# Patient Record
Sex: Female | Born: 1975 | Race: White | Hispanic: No | Marital: Single | Smoking: Never smoker
Health system: Southern US, Community
[De-identification: ages and names within clinical notes are randomized; demographics above are authoritative.]

---

## 2010-07-11 ENCOUNTER — Ambulatory Visit: Payer: Self-pay | Admitting: Internal Medicine

## 2012-08-30 IMAGING — CR DG SHOULDER 3+V*R*
1 series · 4 of 4 positions shown · non-contrast
Comparison: none

REASON FOR EXAM: R shoulder pain due to fall
COMMENTS:

PROCEDURE:     MDR - MDR SHOULDER RIGHT COMPLETE  - July 11, 2010  [DATE]
RESULT:     Comparison: None.

[Series 1: view not recorded · 0.17mm/px · 4 of 4 slices shown]
[im 1/4]
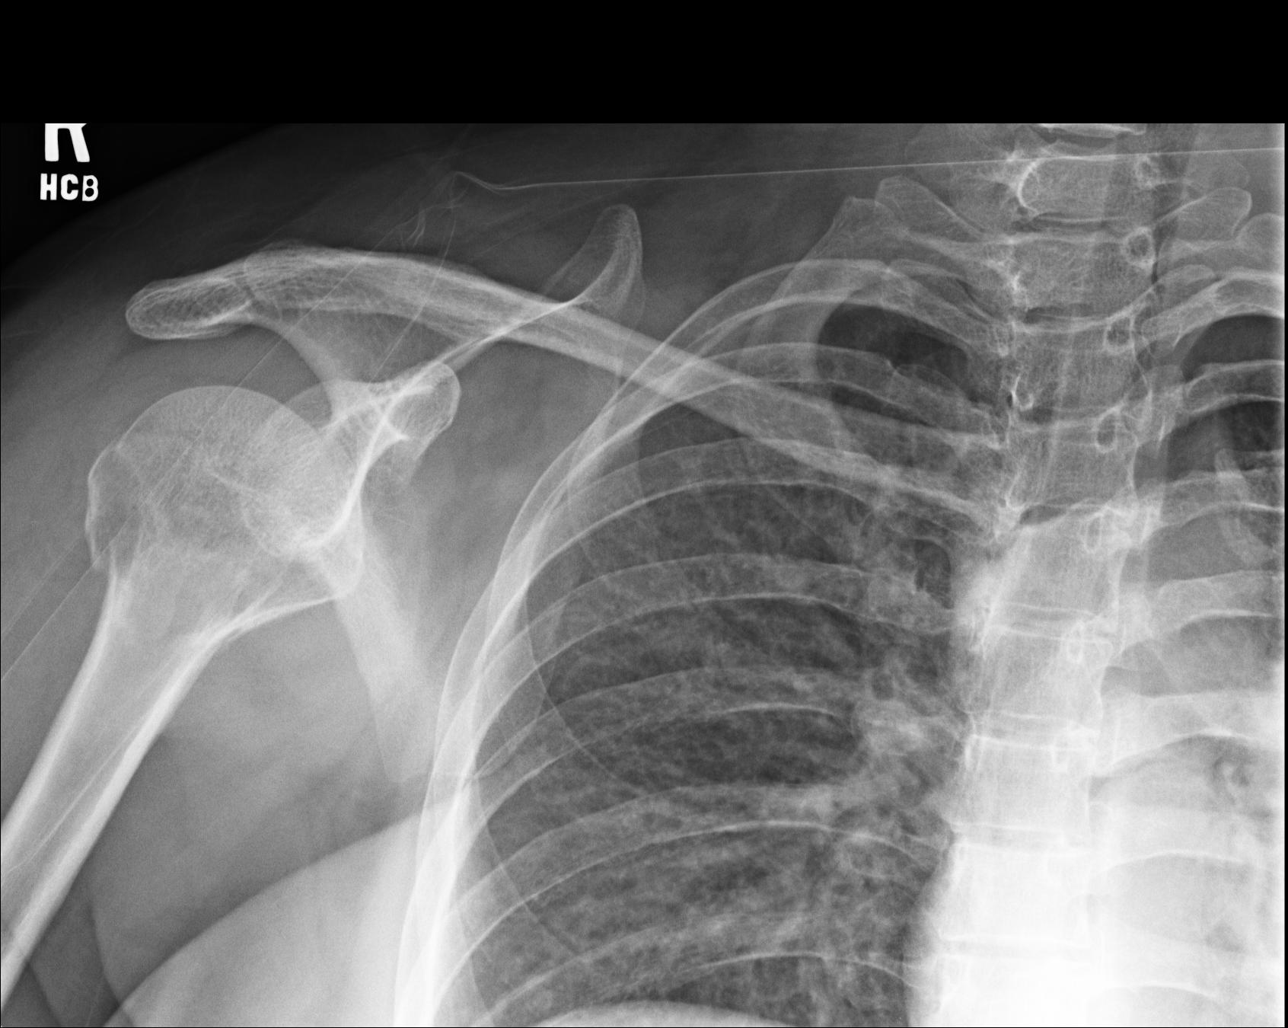
[im 2/4]
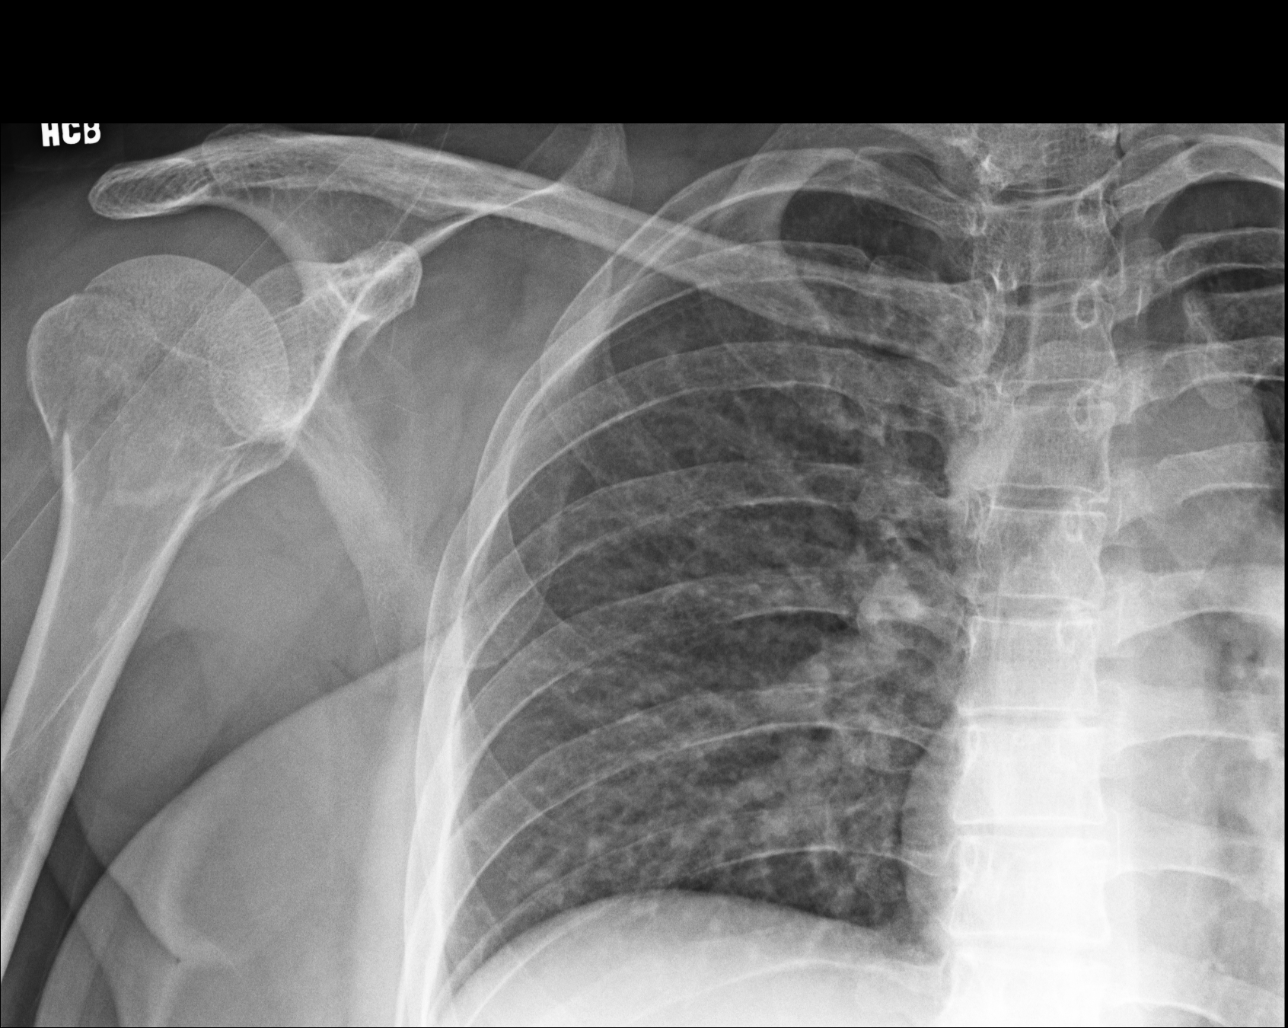
[im 3/4]
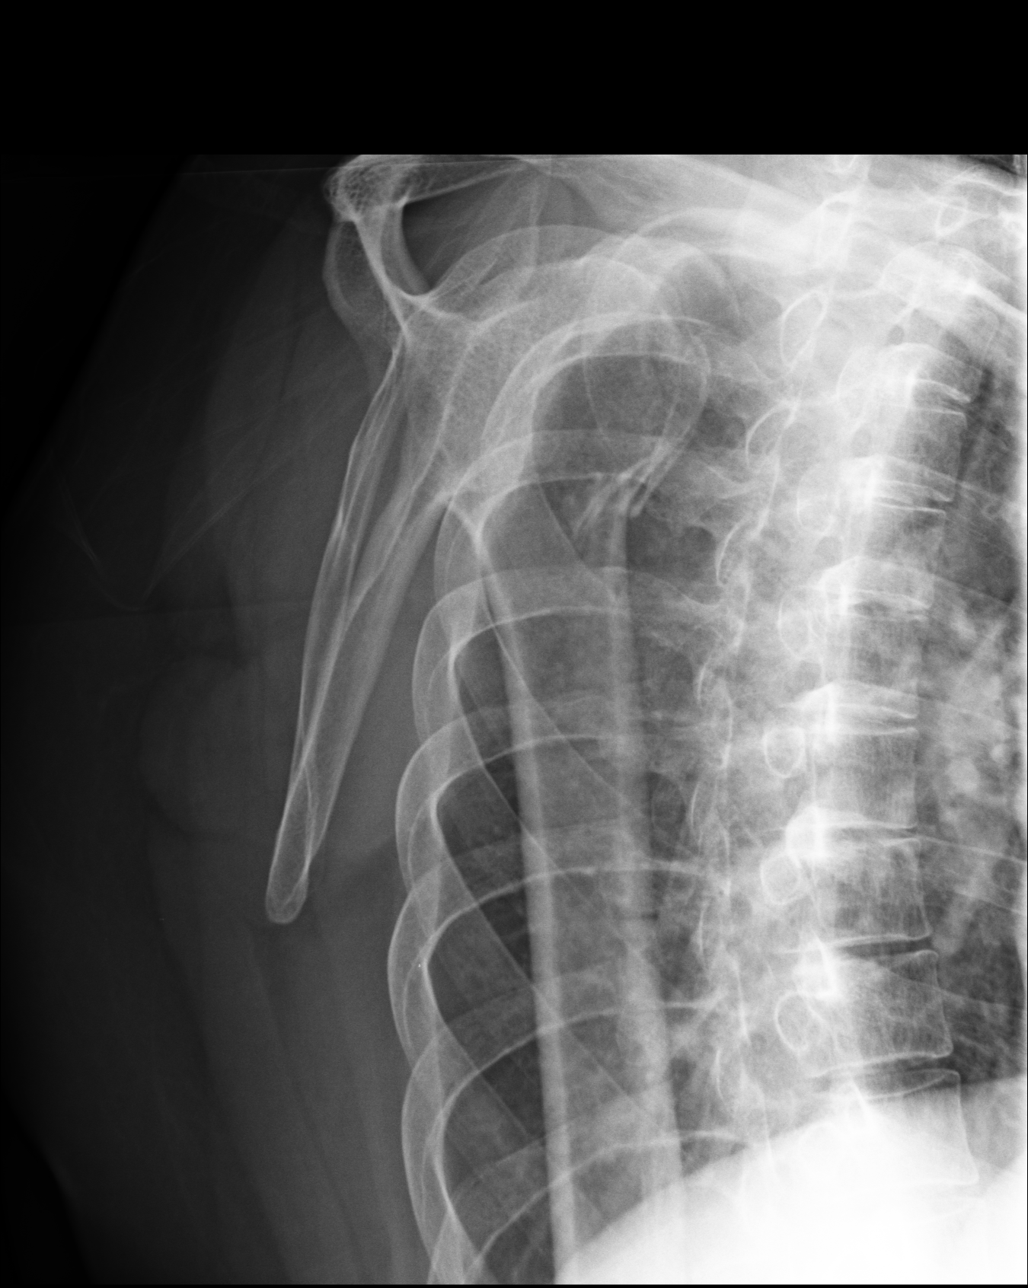
[im 4/4]
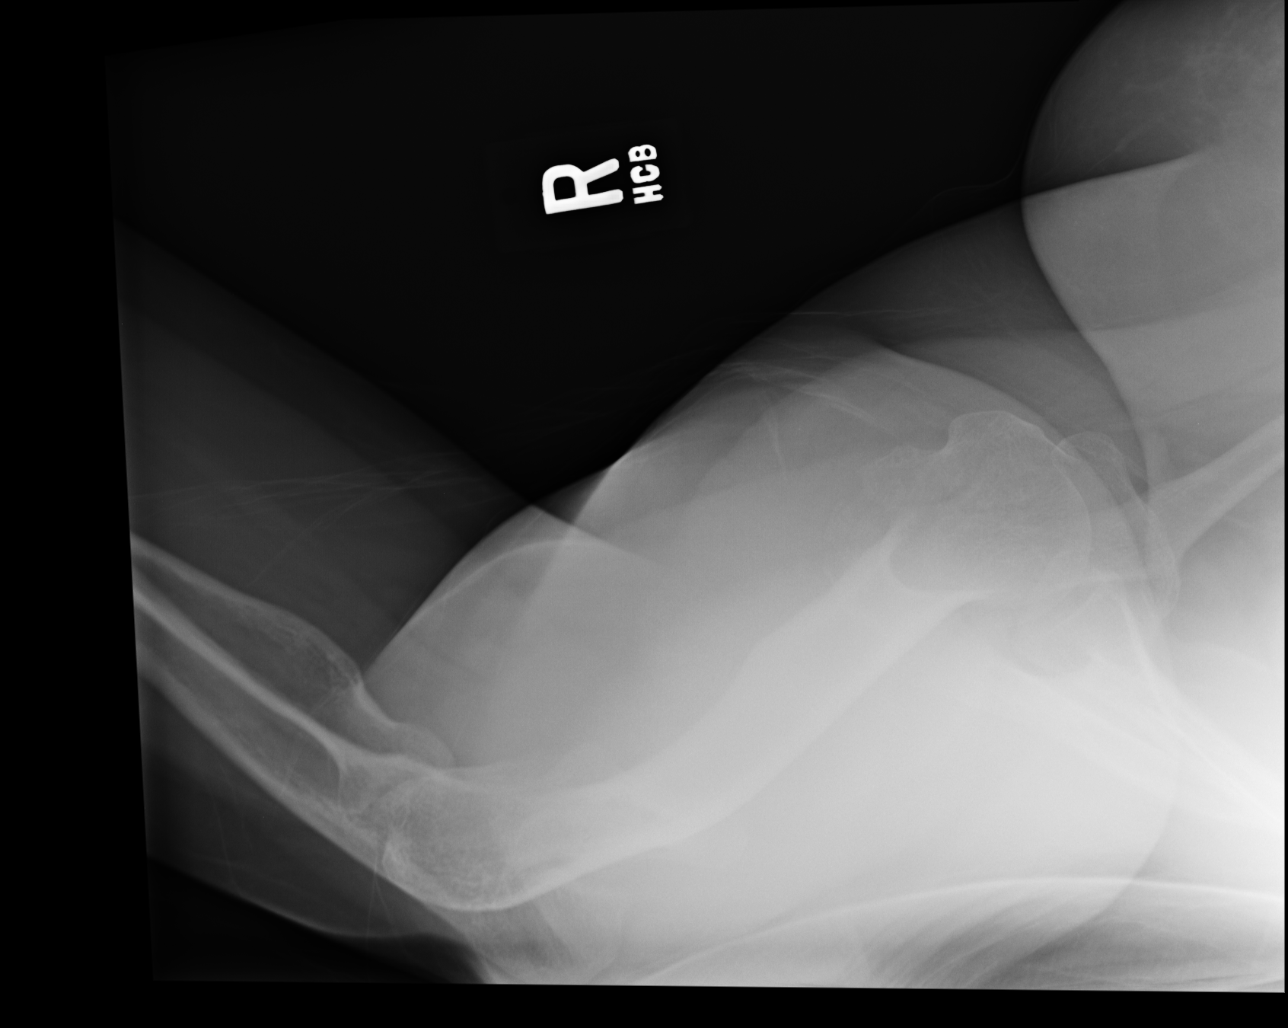

[4 of 4 positions shown; findings below may reference images not displayed]

FINDINGS: There is a comminuted fracture of the humeral head and neck junction. The
fracture appears to involves the greater tuberosity. The humeral head is
located within the glenoid fossa.
IMPRESSION: Comminuted fracture of the humeral head and neck junction.

## 2014-07-20 ENCOUNTER — Ambulatory Visit
Admit: 2014-07-20 | Disposition: A | Discharge status: Home / Self Care | Payer: Self-pay | Attending: Internal Medicine | Admitting: Internal Medicine

## 2016-03-14 HISTORY — PX: COLPOSCOPY: SHX161

## 2016-03-25 ENCOUNTER — Ambulatory Visit
Admission: EM | Admit: 2016-03-25 | Discharge: 2016-03-25 | Disposition: A | Discharge status: Home / Self Care | Payer: BLUE CROSS/BLUE SHIELD | Attending: Internal Medicine | Admitting: Internal Medicine

## 2016-03-25 DIAGNOSIS — R109 Unspecified abdominal pain: Secondary | ICD-10-CM

## 2016-03-25 DIAGNOSIS — R350 Frequency of micturition: Secondary | ICD-10-CM | POA: Diagnosis not present

## 2016-03-25 LAB — URINALYSIS, COMPLETE (UACMP) WITH MICROSCOPIC
BILIRUBIN URINE: NEGATIVE
Glucose, UA: NEGATIVE mg/dL
KETONES UR: NEGATIVE mg/dL
NITRITE: NEGATIVE
PROTEIN: NEGATIVE mg/dL
Specific Gravity, Urine: 1.01 (ref 1.005–1.030)
pH: 7 (ref 5.0–8.0)

## 2016-03-25 MED ORDER — ONDANSETRON 8 MG PO TBDP
8.0000 mg | ORAL_TABLET | Freq: Once | ORAL | Status: AC | PRN
  Administered 2016-03-25: 8 mg via ORAL

## 2016-03-25 MED ORDER — IBUPROFEN 800 MG PO TABS
800.0000 mg | ORAL_TABLET | Freq: Once | ORAL | Status: AC
  Administered 2016-03-25: 800 mg via ORAL

## 2016-03-25 MED ORDER — CIPROFLOXACIN HCL 500 MG PO TABS: 500.0000 mg | ORAL_TABLET | Freq: Two times a day (BID) | ORAL | 0 refills | Status: AC

## 2016-03-25 MED ORDER — IBUPROFEN 600 MG PO TABS: 600.0000 mg | ORAL_TABLET | Freq: Once | ORAL | Status: DC

## 2016-03-25 MED ORDER — ONDANSETRON HCL 4 MG PO TABS: 8.0000 mg | ORAL_TABLET | ORAL | 0 refills | Status: AC | PRN

## 2016-03-25 NOTE — ED Triage Notes (Signed)
Patient complains of back pain and bladder pressure that originally started 8 days ago, improved and worsened again on Wednesday. Patient states that back pain is severe with nausea.

## 2016-03-25 NOTE — ED Provider Notes (Signed)
[facility]    CSN: [id number] Arrival date & time: 03/25/16  [id number]     History   Chief Complaint Chief Complaint  Patient presents with  . Back Pain    HPI Monique Serrano is a 40 y.o. female. She presents today with recent history of a colposcopy November 27. She subsequently had that of menses on November 29.  On November 30, had onset of pelvic pressure, urinary frequency/urgency, and right-sided back/flank discomfort. She has been taking 400 mg of ibuprofen and using the heating pad. She felt better by December 5, but then was up all night last night with recurrent discomfort, and is here today. She has had nausea for the last 3 days, no vomiting, no diarrhea. Last bowel movement was this morning, and was normal. No unusual vaginal discharge. No prior history of UTI or kidney stone; father and sister have had kidney stones.    HPI  History reviewed. No pertinent past medical history.   Past Surgical History:  Procedure Laterality Date  . COLPOSCOPY  03/14/2016     Home Medications    Prior to Admission medications   Medication Sig Start Date End Date Taking? Authorizing Provider  buPROPion (WELLBUTRIN XL) 150 MG 24 hr tablet Take 150 mg by mouth daily.   Yes Historical Provider, MD  levonorgestrel-ethinyl estradiol (AVIANE,ALESSE,LESSINA) 0.1-20 MG-MCG tablet Take 1 tablet by mouth daily.   Yes Historical Provider, MD  LORazepam (ATIVAN) 0.5 MG tablet Take 0.5 mg by mouth every 8 (eight) hours.   Yes Historical Provider, MD  sertraline (ZOLOFT) 100 MG tablet Take 100 mg by mouth daily.   Yes Historical Provider, MD  valACYclovir (VALTREX) 1000 MG tablet Take 1,000 mg by mouth 2 (two) times daily.   Yes Historical Provider, MD  ciprofloxacin (CIPRO) 500 MG tablet Take 1 tablet (500 mg total) by mouth 2 (two) times daily. 03/25/16   Eustace MooreLaura W Denajah Farias, MD  ondansetron (ZOFRAN) 4 MG tablet Take 2 tablets (8 mg total) by mouth every 4 (four) hours as needed for nausea  or vomiting. 03/25/16   Eustace MooreLaura W Raya Mckinstry, MD    Family History History reviewed. No pertinent family history.  Social History Social History  Substance Use Topics  . Smoking status: Never Smoker  . Smokeless tobacco: Never Used  . Alcohol use No     Allergies   Patient has no known allergies.   Review of Systems Review of Systems  All other systems reviewed and are negative.    Physical Exam Triage Vital Signs ED Triage Vitals  Enc Vitals Group     BP 03/25/16 1045 120/89     Pulse Rate 03/25/16 1045 97     Resp 03/25/16 1045 17     Temp 03/25/16 1045 98 F (36.7 C)     Temp Source 03/25/16 1045 Oral     SpO2 03/25/16 1045 100 %     Weight 03/25/16 1041 193 lb (87.5 kg)     Height 03/25/16 1041 5\' 1"  (1.549 m)     Pain Score 03/25/16 1045 9   Updated Vital Signs BP 120/89 (BP Location: Left Arm)   Pulse 97   Temp 98 F (36.7 C) (Oral)   Resp 17   Ht 5\' 1"  (1.549 m)   Wt 193 lb (87.5 kg)   LMP 03/16/2016   SpO2 100%   BMI 36.47 kg/m  Physical Exam  Constitutional: She is oriented to person, place, and time. No distress.  Alert, nicely  groomed  HENT:  Head: Atraumatic.  Eyes:  Conjugate gaze, no eye redness/drainage  Neck: Neck supple.  Cardiovascular: Normal rate.   Pulmonary/Chest: No respiratory distress.  Lungs clear, symmetric breath sounds  Abdominal: She exhibits no distension.  Musculoskeletal: Normal range of motion.  No leg swelling  Neurological: She is alert and oriented to person, place, and time.  Skin: Skin is warm and dry.  No cyanosis  Nursing note and vitals reviewed.    UC Treatments / Results  Labs Results for orders placed or performed during the hospital encounter of 03/25/16  Urinalysis, Complete w Microscopic  Result Value Ref Range   Color, Urine YELLOW YELLOW   APPearance HAZY (A) CLEAR   Specific Gravity, Urine 1.010 1.005 - 1.030   pH 7.0 5.0 - 8.0   Glucose, UA NEGATIVE NEGATIVE mg/dL   Hgb urine dipstick TRACE  (A) NEGATIVE   Bilirubin Urine NEGATIVE NEGATIVE   Ketones, ur NEGATIVE NEGATIVE mg/dL   Protein, ur NEGATIVE NEGATIVE mg/dL   Nitrite NEGATIVE NEGATIVE   Leukocytes, UA MODERATE (A) NEGATIVE   Squamous Epithelial / LPF 6-30 (A) NONE SEEN   WBC, UA 6-30 0 - 5 WBC/hpf   RBC / HPF 0-5 0 - 5 RBC/hpf   Bacteria, UA MANY (A) NONE SEEN    Procedures Procedures (including critical care time)  Medications Ordered in UC Medications  ondansetron (ZOFRAN-ODT) disintegrating tablet 8 mg (8 mg Oral Given 03/25/16 1105)  ibuprofen (ADVIL,MOTRIN) tablet 800 mg (800 mg Oral Given 03/25/16 1105)     Final Clinical Impressions(s) / UC Diagnoses   Final diagnoses:  Urinary frequency  Acute right flank pain   Symptoms today could be caused by either a UTI or a kidney stone.  Urine sample suggests UTI, so prescription for cipro sent to the [facility] in [place].  Note for work x 2d, RTW on 03/27/16.  Recheck for further evaluation if not starting to improve in the next 48 hours.  Ibuprofen 800mg  three times daily may provide more pain relief.  New Prescriptions Discharge Medication List as of 03/25/2016 11:16 AM    START taking these medications   Details  ciprofloxacin (CIPRO) 500 MG tablet Take 1 tablet (500 mg total) by mouth 2 (two) times daily., Starting Fri 03/25/2016, Normal    ondansetron (ZOFRAN) 4 MG tablet Take 2 tablets (8 mg total) by mouth every 4 (four) hours as needed for nausea or vomiting., Starting Fri 03/25/2016, Normal         Eustace MooreLaura W Daqwan Dougal, MD 03/25/16 [phone]

## 2016-03-25 NOTE — Discharge Instructions (Addendum)
Symptoms today could be caused by either a UTI or a kidney stone.  Urine sample suggests UTI, so prescription for cipro sent to the [facility] in [place].  Note for work x 2d, RTW on 03/27/16.  Recheck for further evaluation if not starting to improve in the next 48 hours.  Ibuprofen 800mg  three times daily may provide more pain relief.

## 2016-03-26 LAB — URINE CULTURE
Culture: <10000 — AB
Special Requests: NORMAL

## 2020-09-03 ENCOUNTER — Ambulatory Visit
Admission: EM | Admit: 2020-09-03 | Discharge: 2020-09-03 | Disposition: A | Discharge status: Home / Self Care | Payer: BLUE CROSS/BLUE SHIELD | Attending: Sports Medicine | Admitting: Sports Medicine

## 2020-09-03 ENCOUNTER — Other Ambulatory Visit: Payer: Self-pay

## 2020-09-03 DIAGNOSIS — Z793 Long term (current) use of hormonal contraceptives: Secondary | ICD-10-CM | POA: Insufficient documentation

## 2020-09-03 DIAGNOSIS — Z28310 Unvaccinated for covid-19: Secondary | ICD-10-CM | POA: Diagnosis not present

## 2020-09-03 DIAGNOSIS — M791 Myalgia, unspecified site: Secondary | ICD-10-CM | POA: Insufficient documentation

## 2020-09-03 DIAGNOSIS — R52 Pain, unspecified: Secondary | ICD-10-CM

## 2020-09-03 DIAGNOSIS — Z20822 Contact with and (suspected) exposure to covid-19: Secondary | ICD-10-CM | POA: Diagnosis not present

## 2020-09-03 DIAGNOSIS — R5383 Other fatigue: Secondary | ICD-10-CM | POA: Insufficient documentation

## 2020-09-03 DIAGNOSIS — B349 Viral infection, unspecified: Secondary | ICD-10-CM | POA: Diagnosis not present

## 2020-09-03 DIAGNOSIS — Z79899 Other long term (current) drug therapy: Secondary | ICD-10-CM | POA: Diagnosis not present

## 2020-09-03 DIAGNOSIS — J029 Acute pharyngitis, unspecified: Secondary | ICD-10-CM | POA: Diagnosis present

## 2020-09-03 LAB — RESP PANEL BY RT-PCR (FLU A&B, COVID) ARPGX2
Influenza A by PCR: NEGATIVE
Influenza B by PCR: NEGATIVE
SARS Coronavirus 2 by RT PCR: NEGATIVE

## 2020-09-03 LAB — GROUP A STREP BY PCR: Group A Strep by PCR: NOT DETECTED

## 2020-09-03 NOTE — ED Triage Notes (Signed)
Patient complains of sore throat, fatigue, whole body aches, and severe pain with swallowing.

## 2020-09-03 NOTE — Discharge Instructions (Addendum)
Your strep test is negative. Your respiratory panel was negative for COVID and influenza. You probably have some other viral process. Lungs were clear so you do not need an inhaler. There is no bacterial process and no antibiotics. Educational handouts provided. Plenty of rest, plenty of fluids, Tylenol or Motrin for any fever or discomfort. If symptoms persist please see your primary care provider. If they worsen please go to the ER.

## 2020-09-05 NOTE — ED Provider Notes (Signed)
[facility]    CSN: [id number] Arrival date & time: 09/03/20  1204      History   Chief Complaint Chief Complaint  Patient presents with  . Sore Throat    HPI Monique Serrano is a 45 y.o. female.   Patient is a 45 year old female who presents for evaluation of the above issues.  Gets her primary care needs met by family medicine office in [place].  She works as a Radiation protection practitioner.  She reports several issues.  She reports 1 week of just feeling fatigued and rundown.  The second issue is 2 days of myalgias and sore throat with pain with swallowing.  She denies any sick contacts.  She has not been vaccinated against COVID.  She has had her flu shot.  No COVID exposure or sick contacts that she is aware of.  No COVID history.  She denies any cough, chest pain, shortness of breath, urinary symptoms, abdominal pain.  She is not a smoker.  She has no history of COPD.  No history of asthma.  No red flag signs or symptoms appreciated on history.     History reviewed. No pertinent past medical history.  There are no problems to display for this patient.   Past Surgical History:  Procedure Laterality Date  . COLPOSCOPY  03/14/2016    OB History   No obstetric history on file.      Home Medications    Prior to Admission medications   Medication Sig Start Date End Date Taking? Authorizing Provider  buPROPion (WELLBUTRIN XL) 150 MG 24 hr tablet Take 150 mg by mouth daily.   Yes [provider]  levonorgestrel-ethinyl estradiol (AVIANE,ALESSE,LESSINA) 0.1-20 MG-MCG tablet Take 1 tablet by mouth daily.   Yes [provider]  LORazepam (ATIVAN) 0.5 MG tablet Take 0.5 mg by mouth every 8 (eight) hours.   Yes [provider]  sertraline (ZOLOFT) 100 MG tablet Take 100 mg by mouth daily.   Yes [provider]  valACYclovir (VALTREX) 1000 MG tablet Take 1,000 mg by mouth 2 (two) times daily.   Yes [provider]  ciprofloxacin  (CIPRO) 500 MG tablet Take 1 tablet (500 mg total) by mouth 2 (two) times daily. 03/25/16   Wynona Luna, MD  ondansetron (ZOFRAN) 4 MG tablet Take 2 tablets (8 mg total) by mouth every 4 (four) hours as needed for nausea or vomiting. 03/25/16   Wynona Luna, MD    Family History History reviewed. No pertinent family history.  Social History Social History   Tobacco Use  . Smoking status: Never Smoker  . Smokeless tobacco: Never Used  Substance Use Topics  . Alcohol use: No  . Drug use: No     Allergies   Patient has no known allergies.   Review of Systems Review of Systems  Constitutional: Positive for fatigue. Negative for activity change, appetite change, chills, diaphoresis and fever.  HENT: Positive for sore throat. Negative for congestion, ear pain, postnasal drip, rhinorrhea, sinus pressure, sinus pain and sneezing.   Eyes: Negative for pain and visual disturbance.  Respiratory: Negative for cough, chest tightness and shortness of breath.   Cardiovascular: Negative for chest pain and palpitations.  Gastrointestinal: Negative for abdominal pain, diarrhea, nausea and vomiting.  Genitourinary: Negative for dysuria and hematuria.  Musculoskeletal: Positive for myalgias. Negative for arthralgias, back pain, neck pain and neck stiffness.  Skin: Negative for color change, pallor, rash and wound.  Neurological: Negative for dizziness, seizures, syncope,  light-headedness and headaches.  All other systems reviewed and are negative.    Physical Exam Triage Vital Signs ED Triage Vitals  Enc Vitals Group     BP 09/03/20 1310 114/62     Pulse Rate 09/03/20 1310 60     Resp 09/03/20 1310 18     Temp 09/03/20 1310 98.8 F (37.1 C)     Temp Source 09/03/20 1310 Oral     SpO2 09/03/20 1310 95 %     Weight 09/03/20 1307 204 lb (92.5 kg)     Height 09/03/20 1307 _0  (1.549 m)     Head Circumference --      Peak Flow --      Pain Score 09/03/20 1307 8     Pain  Loc --      Pain Edu? --      Excl. in [place]? --    No data found.  Updated Vital Signs BP 114/62 (BP Location: Left Arm)   Pulse 60   Temp 98.8 F (37.1 C) (Oral)   Resp 18   Ht _1  (1.549 m)   Wt 92.5 kg   LMP 08/13/2020   SpO2 95%   BMI 38.55 kg/m   Visual Acuity Right Eye Distance:   Left Eye Distance:   Bilateral Distance:    Right Eye Near:   Left Eye Near:    Bilateral Near:     Physical Exam Vitals and nursing note reviewed.  Constitutional:      General: She is not in acute distress.    Appearance: Normal appearance. She is well-developed. She is not ill-appearing, toxic-appearing or diaphoretic.  HENT:     Head: Normocephalic and atraumatic.     Right Ear: Tympanic membrane normal.     Left Ear: Tympanic membrane normal.     Nose: Nose normal. No congestion or rhinorrhea.     Mouth/Throat:     Mouth: Mucous membranes are moist. No oral lesions.     Pharynx: Uvula midline. Posterior oropharyngeal erythema present. No pharyngeal swelling, oropharyngeal exudate or uvula swelling.     Tonsils: No tonsillar exudate or tonsillar abscesses. 0 on the right. 0 on the left.  Eyes:     General: No scleral icterus.       Right eye: No discharge.        Left eye: No discharge.     Extraocular Movements: Extraocular movements intact.     Right eye: Normal extraocular motion.     Left eye: Normal extraocular motion.     Conjunctiva/sclera: Conjunctivae normal.     Pupils: Pupils are equal, round, and reactive to light.  Cardiovascular:     Rate and Rhythm: Normal rate and regular rhythm.     Pulses: Normal pulses.     Heart sounds: Normal heart sounds. No murmur heard. No friction rub. No gallop.   Pulmonary:     Effort: Pulmonary effort is normal.     Breath sounds: Normal breath sounds. No stridor. No wheezing, rhonchi or rales.  Musculoskeletal:     Cervical back: Normal range of motion and neck supple. No rigidity or tenderness.  Lymphadenopathy:      Cervical: Cervical adenopathy present.  Skin:    General: Skin is warm and dry.     Capillary Refill: Capillary refill takes less than 2 seconds.     Findings: No bruising, erythema, lesion or rash.  Neurological:     General: No focal deficit present.  Mental Status: She is alert and oriented to person, place, and time.  Psychiatric:        Mood and Affect: Mood normal.      UC Treatments / Results  Labs (all labs ordered are listed, but only abnormal results are displayed) Labs Reviewed  GROUP A STREP BY PCR  RESP PANEL BY RT-PCR (FLU A&B, COVID) [id number]    EKG   Radiology No results found.  Procedures Procedures (including critical care time)  Medications Ordered in UC Medications - No data to display  Initial Impression / Assessment and Plan / UC Course  I have reviewed the triage vital signs and the nursing notes.  Pertinent labs & imaging results that were available during my care of the patient were reviewed by me and considered in my medical decision making (see chart for details).   Clinical impression: 1 week of fatigue followed by several days of a sore throat, pain with swallowing, and myalgias.  No real URI symptoms.  Vital signs and physical exam are reassuring.  Treatment plan: 1.  The findings and treatment plan were discussed in detail with the patient.  Patient was in agreement. 2.  Recommended getting a strep test.  It was negative. 3.  Recommended getting a respiratory panel.  It was negative for COVID and influenza. 4.  Educational handouts provided. 5.  Plenty of rest, plenty of fluids, Tylenol or Motrin for any fever or discomfort. 6.  She asked for a work note and I provided one. 7.  If symptoms persist she should see her primary care provider. 8.  If symptoms worsen she should go to the ER. 9.  She was discharged from care in stable condition and will follow-up here as needed.    Final Clinical Impressions(s) / UC Diagnoses   Final  diagnoses:  Viral illness  Myalgia  Fatigue, unspecified type  Generalized body aches  Sore throat     Discharge Instructions     Your strep test is negative. Your respiratory panel was negative for COVID and influenza. You probably have some other viral process. Lungs were clear so you do not need an inhaler. There is no bacterial process and no antibiotics. Educational handouts provided. Plenty of rest, plenty of fluids, Tylenol or Motrin for any fever or discomfort. If symptoms persist please see your primary care provider. If they worsen please go to the ER.    ED Prescriptions    None     PDMP not reviewed this encounter.   Verda Cumins, MD 09/06/20 [phone]
